# Patient Record
Sex: Female | Born: 1995 | Race: Black or African American | Hispanic: No | Marital: Single | Smoking: Current every day smoker
Health system: Southern US, Community
[De-identification: ages and names within clinical notes are randomized; demographics above are authoritative.]

## PROBLEM LIST (undated history)

## (undated) DIAGNOSIS — L83 Acanthosis nigricans: Secondary | ICD-10-CM

## (undated) DIAGNOSIS — R6250 Unspecified lack of expected normal physiological development in childhood: Secondary | ICD-10-CM

## (undated) DIAGNOSIS — E669 Obesity, unspecified: Secondary | ICD-10-CM

## (undated) DIAGNOSIS — R1013 Epigastric pain: Secondary | ICD-10-CM

## (undated) DIAGNOSIS — R7303 Prediabetes: Secondary | ICD-10-CM

## (undated) DIAGNOSIS — E049 Nontoxic goiter, unspecified: Secondary | ICD-10-CM

## (undated) DIAGNOSIS — E301 Precocious puberty: Secondary | ICD-10-CM

## (undated) HISTORY — DX: Unspecified lack of expected normal physiological development in childhood: R62.50

## (undated) HISTORY — DX: Precocious puberty: E30.1

## (undated) HISTORY — DX: Nontoxic goiter, unspecified: E04.9

## (undated) HISTORY — DX: Acanthosis nigricans: L83

## (undated) HISTORY — DX: Epigastric pain: R10.13

## (undated) HISTORY — DX: Obesity, unspecified: E66.9

## (undated) HISTORY — DX: Prediabetes: R73.03

---
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
## 2019-06-04 ENCOUNTER — Ambulatory Visit: Payer: 59 | Attending: Internal Medicine

## 2019-06-04 DIAGNOSIS — Z20822 Contact with and (suspected) exposure to covid-19: Secondary | ICD-10-CM

## 2019-06-05 LAB — NOVEL CORONAVIRUS, NAA: SARS-CoV-2, NAA: NOT DETECTED

## 2019-06-06 ENCOUNTER — Other Ambulatory Visit: Payer: Self-pay

## 2019-06-06 ENCOUNTER — Ambulatory Visit (HOSPITAL_COMMUNITY)
Admission: EM | Admit: 2019-06-06 | Discharge: 2019-06-06 | Discharge status: Against Medical Advice | Payer: 59 | Attending: Family Medicine | Admitting: Family Medicine

## 2019-06-06 NOTE — ED Notes (Signed)
No response from waiting area. 

## 2019-06-07 ENCOUNTER — Encounter (HOSPITAL_BASED_OUTPATIENT_CLINIC_OR_DEPARTMENT_OTHER): Payer: Self-pay

## 2019-06-07 ENCOUNTER — Other Ambulatory Visit: Payer: Self-pay

## 2019-06-07 ENCOUNTER — Emergency Department (HOSPITAL_BASED_OUTPATIENT_CLINIC_OR_DEPARTMENT_OTHER)
Admission: EM | Admit: 2019-06-07 | Discharge: 2019-06-07 | Disposition: A | Discharge status: Home / Self Care | Payer: 59 | Attending: Emergency Medicine | Admitting: Emergency Medicine

## 2019-06-07 DIAGNOSIS — Z5321 Procedure and treatment not carried out due to patient leaving prior to being seen by health care provider: Secondary | ICD-10-CM | POA: Diagnosis not present

## 2019-06-07 DIAGNOSIS — R350 Frequency of micturition: Secondary | ICD-10-CM | POA: Diagnosis present

## 2019-06-07 DIAGNOSIS — Z113 Encounter for screening for infections with a predominantly sexual mode of transmission: Secondary | ICD-10-CM | POA: Diagnosis not present

## 2019-06-07 NOTE — ED Notes (Signed)
Pt states she doesn't want to wait any longer to be seen.  Discussed options for her and she will attempt to get an appt with MD

## 2019-06-07 NOTE — ED Triage Notes (Signed)
Pt arrives to ED POV stating that she took an at home STD check which said she "had oral gonorrhea andneeded to get an antibiotic injection". Pt also c/o frequent urination and some abdominal discomfort.

## 2019-07-12 ENCOUNTER — Ambulatory Visit: Payer: 59 | Attending: Internal Medicine

## 2019-07-12 DIAGNOSIS — Z20822 Contact with and (suspected) exposure to covid-19: Secondary | ICD-10-CM

## 2019-07-13 LAB — NOVEL CORONAVIRUS, NAA: SARS-CoV-2, NAA: NOT DETECTED

## 2019-08-13 ENCOUNTER — Other Ambulatory Visit: Payer: 59

## 2020-02-15 ENCOUNTER — Other Ambulatory Visit: Payer: 59

## 2020-03-30 ENCOUNTER — Other Ambulatory Visit: Payer: Self-pay | Admitting: Physician Assistant

## 2020-03-30 ENCOUNTER — Other Ambulatory Visit (HOSPITAL_COMMUNITY)
Admission: RE | Admit: 2020-03-30 | Discharge: 2020-03-30 | Disposition: A | Discharge status: Home / Self Care | Payer: BC Managed Care – PPO | Source: Ambulatory Visit | Attending: Physician Assistant | Admitting: Physician Assistant

## 2020-03-30 DIAGNOSIS — Z124 Encounter for screening for malignant neoplasm of cervix: Secondary | ICD-10-CM | POA: Diagnosis not present

## 2020-04-03 LAB — CYTOLOGY - PAP: Diagnosis: NEGATIVE
